# Patient Record
Sex: Male | Born: 1960 | Race: White | Hispanic: No | Smoking: Current every day smoker
Health system: Southern US, Community
[De-identification: ages and names within clinical notes are randomized; demographics above are authoritative.]

## PROBLEM LIST (undated history)

## (undated) DIAGNOSIS — I4891 Unspecified atrial fibrillation: Secondary | ICD-10-CM

## (undated) DIAGNOSIS — C649 Malignant neoplasm of unspecified kidney, except renal pelvis: Secondary | ICD-10-CM

## (undated) DIAGNOSIS — I10 Essential (primary) hypertension: Secondary | ICD-10-CM

## (undated) DIAGNOSIS — I519 Heart disease, unspecified: Secondary | ICD-10-CM

## (undated) HISTORY — DX: Malignant neoplasm of unspecified kidney, except renal pelvis: C64.9

## (undated) HISTORY — DX: Heart disease, unspecified: I51.9

## (undated) HISTORY — DX: Essential (primary) hypertension: I10

## (undated) HISTORY — DX: Unspecified atrial fibrillation: I48.91

---

## 1999-06-30 ENCOUNTER — Ambulatory Visit: Admission: RE | Admit: 1999-06-30 | Discharge: 1999-06-30 | Payer: Self-pay | Admitting: *Deleted

## 1999-08-31 ENCOUNTER — Other Ambulatory Visit: Admission: RE | Admit: 1999-08-31 | Discharge: 1999-08-31 | Payer: Self-pay | Admitting: *Deleted

## 2003-05-12 ENCOUNTER — Ambulatory Visit (HOSPITAL_COMMUNITY): Admission: RE | Admit: 2003-05-12 | Discharge: 2003-05-12 | Payer: Self-pay | Admitting: Internal Medicine

## 2003-05-18 ENCOUNTER — Ambulatory Visit (HOSPITAL_BASED_OUTPATIENT_CLINIC_OR_DEPARTMENT_OTHER): Admission: RE | Admit: 2003-05-18 | Discharge: 2003-05-18 | Payer: Self-pay | Admitting: Internal Medicine

## 2018-03-29 DIAGNOSIS — I10 Essential (primary) hypertension: Secondary | ICD-10-CM

## 2018-03-29 DIAGNOSIS — I252 Old myocardial infarction: Secondary | ICD-10-CM

## 2018-03-29 DIAGNOSIS — G4733 Obstructive sleep apnea (adult) (pediatric): Secondary | ICD-10-CM

## 2018-03-29 DIAGNOSIS — Z720 Tobacco use: Secondary | ICD-10-CM

## 2018-03-29 DIAGNOSIS — E785 Hyperlipidemia, unspecified: Secondary | ICD-10-CM

## 2018-03-29 DIAGNOSIS — R079 Chest pain, unspecified: Secondary | ICD-10-CM

## 2018-03-29 DIAGNOSIS — I517 Cardiomegaly: Secondary | ICD-10-CM

## 2018-03-29 DIAGNOSIS — Z8673 Personal history of transient ischemic attack (TIA), and cerebral infarction without residual deficits: Secondary | ICD-10-CM

## 2019-01-25 DIAGNOSIS — Z905 Acquired absence of kidney: Secondary | ICD-10-CM

## 2019-01-25 DIAGNOSIS — Z720 Tobacco use: Secondary | ICD-10-CM

## 2019-01-25 DIAGNOSIS — R011 Cardiac murmur, unspecified: Secondary | ICD-10-CM

## 2019-01-25 DIAGNOSIS — I10 Essential (primary) hypertension: Secondary | ICD-10-CM

## 2019-01-25 DIAGNOSIS — E785 Hyperlipidemia, unspecified: Secondary | ICD-10-CM

## 2019-01-25 DIAGNOSIS — Z8673 Personal history of transient ischemic attack (TIA), and cerebral infarction without residual deficits: Secondary | ICD-10-CM

## 2019-01-25 DIAGNOSIS — I4891 Unspecified atrial fibrillation: Secondary | ICD-10-CM

## 2019-06-16 ENCOUNTER — Other Ambulatory Visit: Payer: Self-pay | Admitting: Family Medicine

## 2019-06-25 ENCOUNTER — Other Ambulatory Visit (HOSPITAL_COMMUNITY): Payer: Self-pay | Admitting: Family Medicine

## 2019-06-25 ENCOUNTER — Other Ambulatory Visit: Payer: Self-pay | Admitting: Family Medicine

## 2019-06-25 DIAGNOSIS — M542 Cervicalgia: Secondary | ICD-10-CM

## 2019-07-04 ENCOUNTER — Ambulatory Visit (HOSPITAL_COMMUNITY)
Admission: RE | Admit: 2019-07-04 | Discharge: 2019-07-04 | Disposition: A | Discharge status: Home / Self Care | Payer: No Typology Code available for payment source | Source: Ambulatory Visit | Attending: Family Medicine | Admitting: Family Medicine

## 2019-07-04 ENCOUNTER — Other Ambulatory Visit: Payer: Self-pay

## 2019-07-04 DIAGNOSIS — M542 Cervicalgia: Secondary | ICD-10-CM | POA: Diagnosis not present

## 2019-07-14 ENCOUNTER — Other Ambulatory Visit (HOSPITAL_COMMUNITY): Payer: Self-pay | Admitting: Family Medicine

## 2019-07-14 ENCOUNTER — Other Ambulatory Visit: Payer: Self-pay | Admitting: Family Medicine

## 2019-07-14 DIAGNOSIS — M545 Low back pain, unspecified: Secondary | ICD-10-CM

## 2019-07-26 ENCOUNTER — Ambulatory Visit (HOSPITAL_COMMUNITY)
Admission: RE | Admit: 2019-07-26 | Discharge: 2019-07-26 | Disposition: A | Discharge status: Home / Self Care | Payer: No Typology Code available for payment source | Source: Ambulatory Visit | Attending: Family Medicine | Admitting: Family Medicine

## 2019-07-26 ENCOUNTER — Other Ambulatory Visit: Payer: Self-pay

## 2019-07-26 DIAGNOSIS — M545 Low back pain, unspecified: Secondary | ICD-10-CM

## 2019-09-02 ENCOUNTER — Encounter: Payer: Self-pay | Admitting: Physical Medicine & Rehabilitation

## 2019-09-25 ENCOUNTER — Other Ambulatory Visit: Payer: Self-pay

## 2019-09-25 ENCOUNTER — Encounter
Payer: No Typology Code available for payment source | Attending: Physical Medicine & Rehabilitation | Admitting: Physical Medicine & Rehabilitation

## 2019-09-25 ENCOUNTER — Encounter: Payer: Self-pay | Admitting: Physical Medicine & Rehabilitation

## 2019-09-25 VITALS — BP 142/81 | HR 60 | Temp 98.0°F | Ht 71.0 in | Wt 300.0 lb

## 2019-09-25 DIAGNOSIS — G894 Chronic pain syndrome: Secondary | ICD-10-CM | POA: Diagnosis present

## 2019-09-25 DIAGNOSIS — Z5181 Encounter for therapeutic drug level monitoring: Secondary | ICD-10-CM

## 2019-09-25 DIAGNOSIS — Z79891 Long term (current) use of opiate analgesic: Secondary | ICD-10-CM | POA: Diagnosis not present

## 2019-09-25 MED ORDER — NORTRIPTYLINE HCL 10 MG PO CAPS: 10.0000 mg | ORAL_CAPSULE | Freq: Every day | ORAL | 1 refills | Status: AC

## 2019-09-25 NOTE — Progress Notes (Signed)
Subjective:    Patient ID: Miguel Briggs, male    DOB: Oct 06, 1960, 59 y.o.   MRN: [id number]  HPI Reason for referral :  Neck and back pain 59 year old male with history of atrial fibrillation, CHF, hypertension and diabetes who was referred through the [place] for the evaluation of chronic neck and back pain. 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, maintenance tech, problems with PTSD, gallbladder surgery then back problem  [employer] disability for PTSD, arthritis, spine Discharged from service 1992  Tried acupuncture , TENS, PT but this was not helpful Mobic- not helpful for pain  "instant release " pain med helpful, patient does not remember the name Hydrocodone 5mg  - finished rx from April was written for QID but only took at noc, "scared to take 2 tabs", this at least helps him sleep at night  Wondering if sedative would work  Pain Inventory Average Pain 10 Pain Right Now 5 My pain is constant, sharp, dull, stabbing, tingling and aching  In the last 24 hours, has pain interfered with the following? General activity 10 Relation with others 10 Enjoyment of life 10 What TIME of day is your pain at its worst? evening & night time. Sleep (in general) Poor  Pain is worse with: walking, bending, sitting and standing Pain improves with: rest and medication Relief from Meds: 9  Mobility walk without assistance how many minutes can you walk? 10-15 ability to climb steps?  no do you drive?  yes Do you have any goals in this area?  yes  Function disabled: date disabled I do not remember the date maybe 2000. I need assistance with the following:  dressing Do you have any goals in this area?  yes  Neuro/Psych weakness tingling anxiety  Prior Studies New patient. MRI of the Lumbar Spine that shows multilevel degenerative disc disease. Primarily at L5-S1, there is a central  disc protrusion with slight caudal migration without associated neural compression. MRI of the Cervical Spine without Contrast shows previous ACDF at the C6-7 level. At C5-6, there is some mild adjacent segment disease with mild left neuroforaminal narrowing without severe spinal stenosis centrally at any level. No evidence of cord signal changes.     Physicians involved in your care New patient.   No family history on file. Social History   Socioeconomic History  . Marital status: Unknown    Spouse name: Not on file  . Number of children: Not on file  . Years of education: Not on file  . Highest education level: Not on file  Occupational History  . Not on file  Tobacco Use  . Smoking status: Not on file  Substance and Sexual Activity  . Alcohol use: Not on file  . Drug use: Not on file  . Sexual activity: Not on file  Other Topics Concern  . Not on file  Social History Narrative  . Not on file   Social Determinants of Health   Financial Resource Strain:   . Difficulty of Paying Living Expenses:   Food Insecurity:   . Worried About Charity fundraiser in the Last Year:   . Arboriculturist in the Last Year:   Transportation Needs:   . Film/video editor (Medical):   Marland Kitchen Lack of Transportation (Non-Medical):   Physical Activity:   . Days of Exercise per Week:   .  Minutes of Exercise per Session:   Stress:   . Feeling of Stress :   Social Connections:   . Frequency of Communication with Friends and Family:   . Frequency of Social Gatherings with Friends and Family:   . Attends Religious Services:   . Active Member of Clubs or Organizations:   . Attends Archivist Meetings:   Marland Kitchen Marital Status:     Past Medical History:  Diagnosis Date  . A-fib ([place])   . Cancer of kidney ([place])   . Heart disease   . Hypertension    BP (!) 142/81   Pulse 60   Temp 98 F (36.7 C)   Ht 5\' 11"  (1.803 m)   Wt 300 lb (136.1 kg)   SpO2 96%   BMI 41.84 kg/m    Opioid Risk Score:   Fall Risk Score:  `1  Depression screen PHQ 2/9  Depression screen PHQ 2/9 09/25/2019  Decreased Interest 0  Down, Depressed, Hopeless 0  PHQ - 2 Score 0  Altered sleeping 0  Tired, decreased energy 0  Change in appetite 0  Feeling bad or failure about yourself  0  Trouble concentrating 0  Moving slowly or fidgety/restless 0  Suicidal thoughts 0  PHQ-9 Score 0   Review of Systems  Constitutional: Negative.   HENT: Negative.   Eyes: Negative.   Respiratory: Positive for cough and shortness of breath.   Cardiovascular: Negative.   Gastrointestinal: Positive for abdominal pain, constipation, diarrhea and nausea.  Endocrine: Negative.   Genitourinary: Negative.   Musculoskeletal: Positive for back pain and gait problem.  Skin: Positive for rash.  Allergic/Immunologic: Negative.   Neurological: Positive for weakness.       Tingling, spasms  Psychiatric/Behavioral:       Axiety       Objective:   Physical Exam Vitals and nursing note reviewed.  Constitutional:      Appearance: He is obese.  HENT:     Head: Normocephalic and atraumatic.  Eyes:     Extraocular Movements: Extraocular movements intact.     Conjunctiva/sclera: Conjunctivae normal.     Pupils: Pupils are equal, round, and reactive to light.  Cardiovascular:     Rate and Rhythm: Normal rate and regular rhythm.  Pulmonary:     Effort: Pulmonary effort is normal. No respiratory distress.     Breath sounds: Normal breath sounds. No stridor. No wheezing.  Abdominal:     General: Abdomen is flat. Bowel sounds are normal. There is no distension.     Palpations: Abdomen is soft.     Tenderness: There is no abdominal tenderness.  Musculoskeletal:     Cervical back: Normal range of motion. Tenderness present.     Comments: Lumbar spine tenderness to light palpation along the lumbar paraspinals Thoracic spine tenderness palpation along the thoracic paraspinals Cervical spine tenderness  palpation along the cervical paraspinals There is decreased range of motion the cervical thoracic and lumbar spine all around 25% of normal with flexion and extension  Neurological:     General: No focal deficit present.     Mental Status: He is alert and oriented to person, place, and time.     Cranial Nerves: No dysarthria or facial asymmetry.     Sensory: Sensation is intact.     Motor: No weakness, atrophy or abnormal muscle tone.     Coordination: Coordination normal.     Gait: Gait is intact.     Comments: Motor strength 5/5  bilateral deltoid bicep tricep grip hip flexor knee extensor ankle dorsiflexor Negative straight leg raising Sensation intact to light touch and pinprick in the upper extremities intact light touch in the lower extremities   Psychiatric:        Mood and Affect: Mood normal.        Behavior: Behavior normal.     Comments: Groaning, pain behaviors with position changes           Assessment & Plan:  #1.  Chronic lumbar pain without radiculopathy.  Given age and high BMI suspect lumbar spondylosis as the primary pain generator we will schedule for bilateral L3-L4 medial branch L5 dorsal ramus injections under fluoroscopic guidance. 2.  Insomnia related to pain with myofascial component to pain will start nortriptyline 10 mg nightly may need to titrate upward with max dose of 50 mg if there is no intolerance #3.  Deconditioning will start with gentle spine range of motion and strengthening exercises for gluteal and abdominal musculature. We discussed aquatic exercise class at the pool, a seniors class would be appropriate given his level of conditioning.  UDS if night time tramadol or hydrocodone needed

## 2019-09-25 NOTE — Patient Instructions (Signed)

## 2019-09-29 LAB — DRUG TOX MONITOR 1 W/CONF, ORAL FLD
Amphetamines: NEGATIVE ng/mL (ref <10)
Barbiturates: NEGATIVE ng/mL (ref <10)
Benzodiazepines: NEGATIVE ng/mL (ref <0.50)
Buprenorphine: NEGATIVE ng/mL (ref <0.10)
Cocaine: NEGATIVE ng/mL (ref <5.0)
Cotinine: >250 ng/mL — ABNORMAL HIGH (ref <5.0)
Fentanyl: NEGATIVE ng/mL (ref <0.10)
Heroin Metabolite: NEGATIVE ng/mL (ref <1.0)
MARIJUANA: NEGATIVE ng/mL (ref <2.5)
MDMA: NEGATIVE ng/mL (ref <10)
Meprobamate: NEGATIVE ng/mL (ref <2.5)
Methadone: NEGATIVE ng/mL (ref <5.0)
Nicotine Metabolite: POSITIVE ng/mL — AB (ref <5.0)
Opiates: NEGATIVE ng/mL (ref <2.5)
Phencyclidine: NEGATIVE ng/mL (ref <10)
Tapentadol: NEGATIVE ng/mL (ref <5.0)
Tramadol: NEGATIVE ng/mL (ref <5.0)
Zolpidem: NEGATIVE ng/mL (ref <5.0)

## 2019-09-29 LAB — DRUG TOX ALC METAB W/CON, ORAL FLD: Alcohol Metabolite: NEGATIVE ng/mL (ref <25)

## 2019-10-01 ENCOUNTER — Telehealth: Payer: Self-pay | Admitting: *Deleted

## 2019-10-01 NOTE — Telephone Encounter (Signed)
Oral swab drug screen was consistent for no prescribed controlled medications.

## 2019-10-16 ENCOUNTER — Encounter: Payer: No Typology Code available for payment source | Admitting: Physical Medicine & Rehabilitation

## 2019-11-07 ENCOUNTER — Ambulatory Visit: Payer: Self-pay | Admitting: Physical Medicine & Rehabilitation

## 2021-11-20 IMAGING — MR MR CERVICAL SPINE W/O CM
4 of 6 series · 19 of 48 positions shown · non-contrast
Comparison: None.

CLINICAL DATA: Neck pain with tingling for 3 weeks.

EXAM:
MRI CERVICAL SPINE WITHOUT CONTRAST
TECHNIQUE: Multiplanar, multisequence MR imaging of the cervical spine was
performed. No intravenous contrast was administered.

[Series 2: T2 · sagittal · 3.0mm · 0.41mm/px · 3 of 16 slices shown (1 of 2)]
[im 1/16]
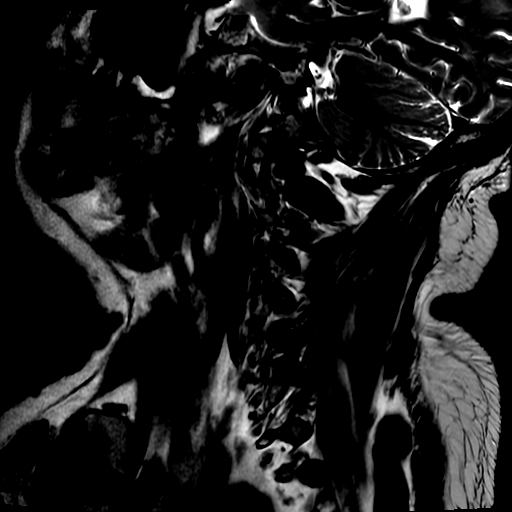
[im 8/16]
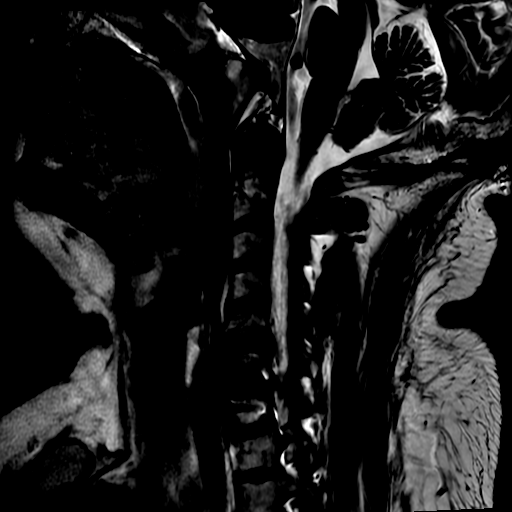
[im 16/16]
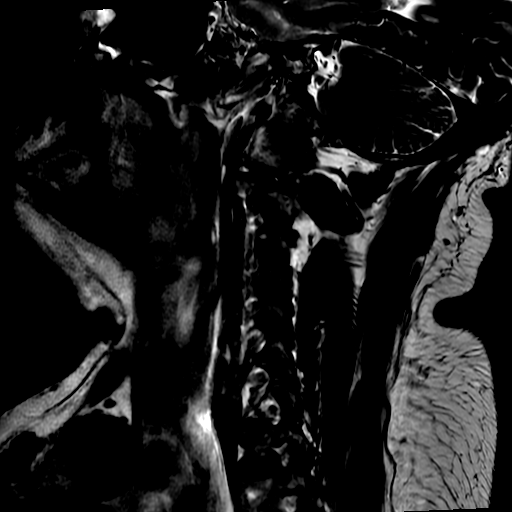

[Series 4: STIR · sagittal · 3.0mm · 0.41mm/px · 3 of 16 slices shown]
[im 1/16]
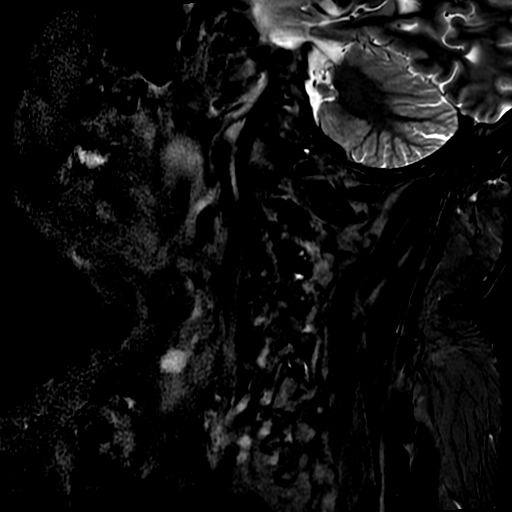
[im 8/16]
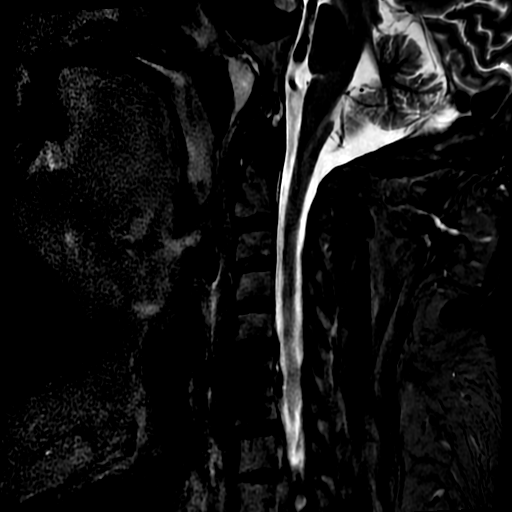
[im 16/16]
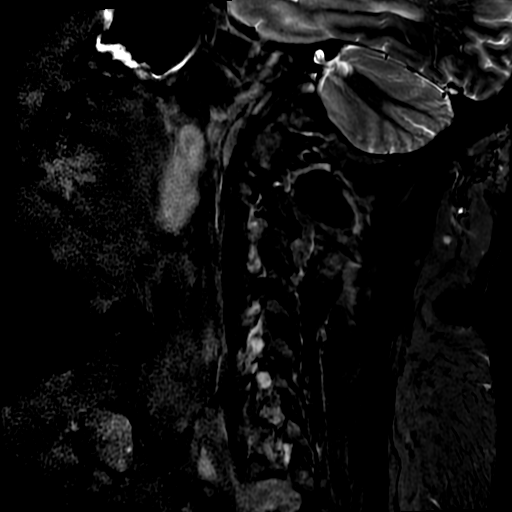

[Series 6: T2 · axial · 3.0mm · 0.35mm/px · z∈[-148,-26]mm · 8 of 39 slices shown (2 of 2)]
[im 1/39]
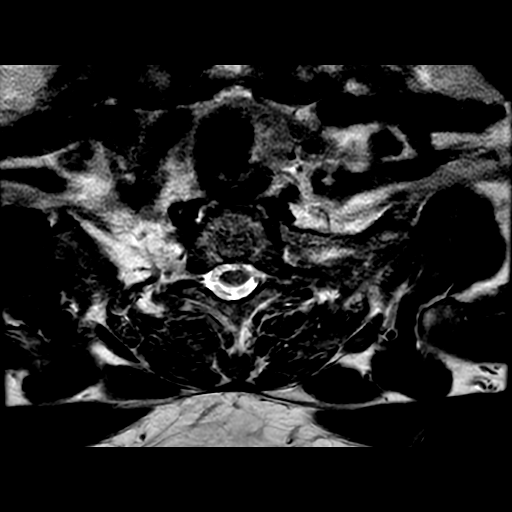
[im 6/39]
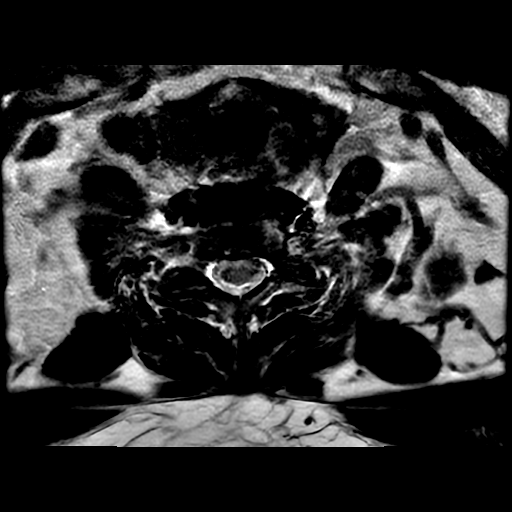
[im 11/39]
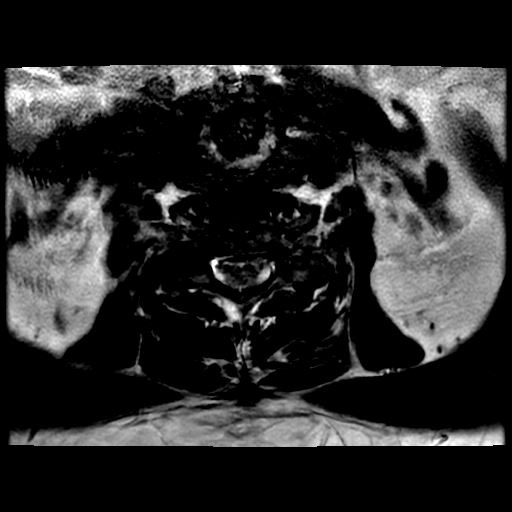
[im 17/39]
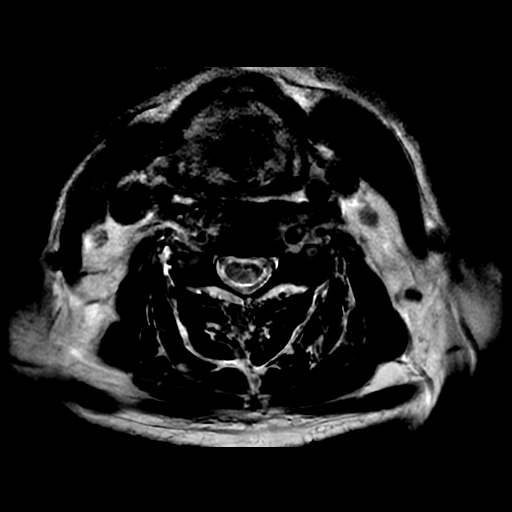
[im 22/39]
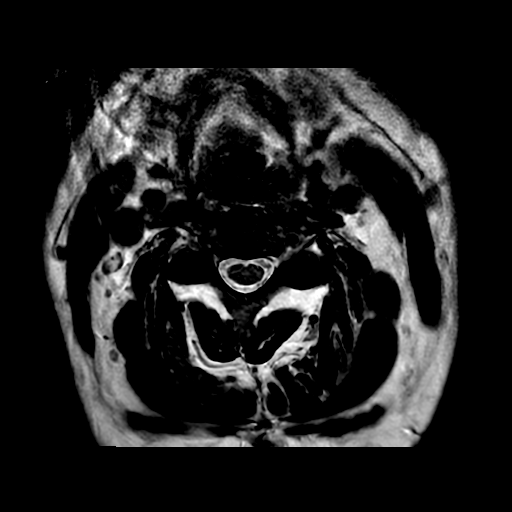
[im 28/39]
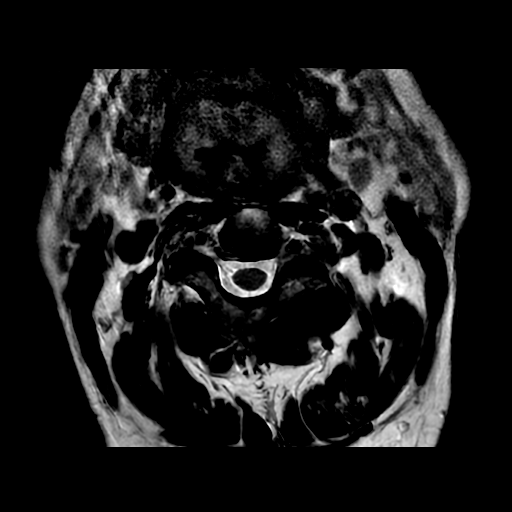
[im 33/39]
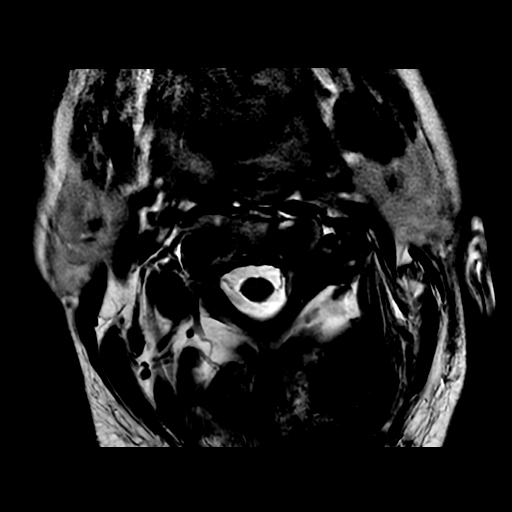
[im 39/39]
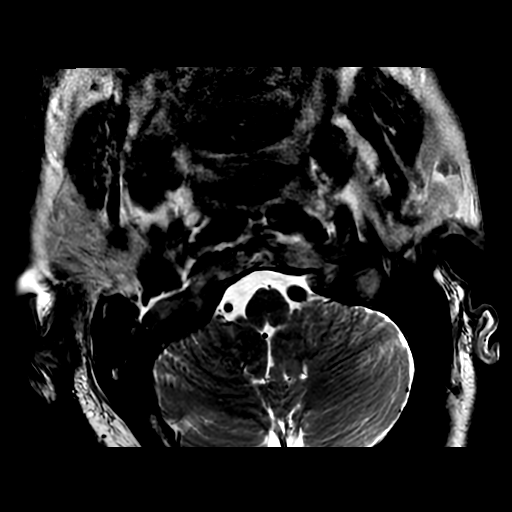

[Series 7: T1 · axial · non-contrast · 3.0mm · 0.35mm/px · z∈[-151,-48]mm · 5 of 39 slices shown]
[im 1/39]
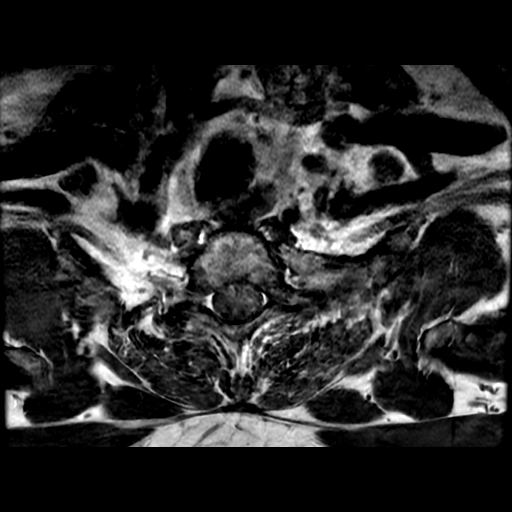
[im 6/39]
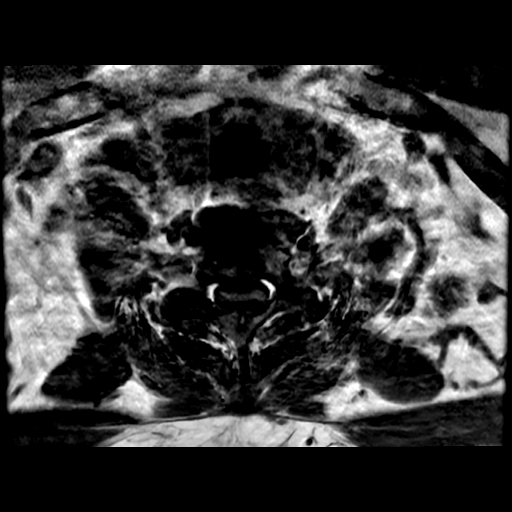
[im 11/39]
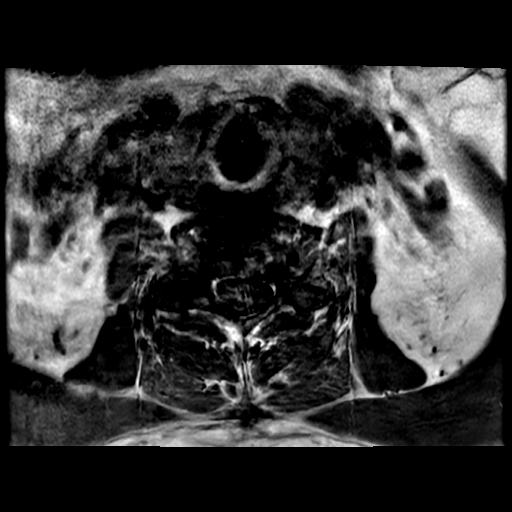
[im 22/39]
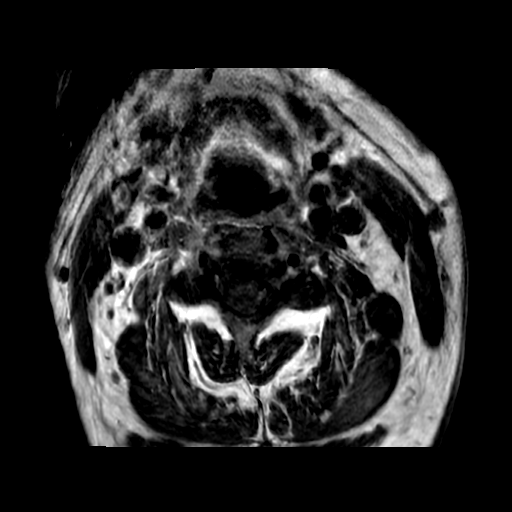
[im 33/39]
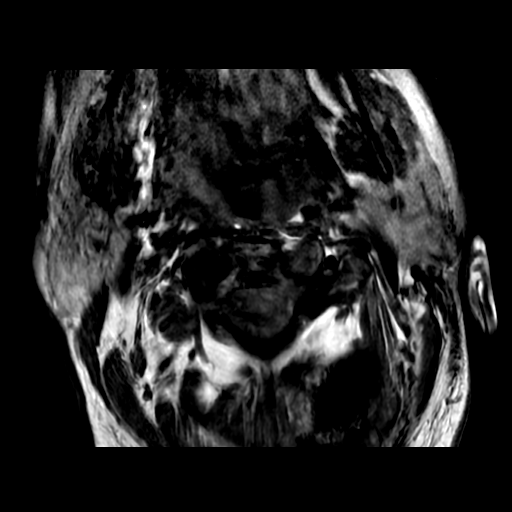

[19 of 48 positions shown; findings below may reference images not displayed]

FINDINGS: The study is motion degraded including moderate to severe motion on
the axial T1 and axial T2 gradient sequences.

Alignment: Cervical spine straightening. No listhesis.

Vertebrae: C6-7 ACDF. No fracture, suspicious osseous lesion, or
significant marrow edema.

Cord: Normal signal and morphology.

Posterior Fossa, vertebral arteries, paraspinal tissues: Small right
maxillary sinus mucous retention cyst. Preserved vertebral artery
flow voids. No paraspinal soft tissue edema. 1 cm T2 hyperintense
left thyroid nodule (not clinically significant; no follow-up
imaging recommended).

Disc levels:

C2-3: Moderate facet arthrosis and minimal uncovertebral spurring
result in mild right neural foraminal stenosis without spinal
stenosis.

C3-4: Mild facet arthrosis and minimal uncovertebral spurring result
in mild right neural foraminal stenosis without spinal stenosis.

C4-5: Mild facet arthrosis and left uncovertebral spurring result in
mild left neural foraminal stenosis without spinal stenosis.

C5-6: Small central disc protrusion/possible extrusion without
spinal stenosis or spinal cord mass effect. Mild disc bulging and
left uncovertebral spurring result in mild left neural foraminal
stenosis.

C6-7: ACDF. No stenosis.

C7-T1: Mild right uncovertebral spurring without stenosis.
IMPRESSION: 1. Motion degraded examination.
2. C6-7 ACDF without stenosis.
3. Mild spondylosis and facet degeneration elsewhere with mild
neural foraminal narrowing as above. No spinal stenosis.

## 2022-02-03 DIAGNOSIS — I517 Cardiomegaly: Secondary | ICD-10-CM

## 2024-04-24 ENCOUNTER — Ambulatory Visit (HOSPITAL_BASED_OUTPATIENT_CLINIC_OR_DEPARTMENT_OTHER)
Admission: EM | Admit: 2024-04-24 | Discharge: 2024-04-24 | Disposition: A | Discharge status: Home / Self Care | Source: Home / Self Care

## 2024-04-24 ENCOUNTER — Other Ambulatory Visit (HOSPITAL_BASED_OUTPATIENT_CLINIC_OR_DEPARTMENT_OTHER): Payer: Self-pay

## 2024-04-24 ENCOUNTER — Encounter (HOSPITAL_BASED_OUTPATIENT_CLINIC_OR_DEPARTMENT_OTHER): Payer: Self-pay

## 2024-04-24 DIAGNOSIS — H5789 Other specified disorders of eye and adnexa: Secondary | ICD-10-CM

## 2024-04-24 MED ORDER — ERYTHROMYCIN 5 MG/GM OP OINT
TOPICAL_OINTMENT | OPHTHALMIC | 0 refills | Status: AC
  Filled 2024-04-24: qty 3.5, 7d supply, fill #0

## 2024-04-24 MED ORDER — AMOXICILLIN-POT CLAVULANATE 875-125 MG PO TABS
1.0000 | ORAL_TABLET | Freq: Two times a day (BID) | ORAL | 0 refills | Status: AC
  Filled 2024-04-24: qty 14, 7d supply, fill #0

## 2024-04-24 NOTE — ED Provider Notes (Signed)
 " [facility]    CSN: [id number] Arrival date & time: 04/24/24  1614      History   Chief Complaint Chief Complaint  Patient presents with   Eye Problem    HPI Miguel Briggs is a 64 y.o. male.   Pt c/o left eye redness, swelling, and itching around his eye for 2 days. He has used ice and warm compresses with no relief. Reports he has had eye infections and hx of eye implants in the past. No pain or fever.     Eye Problem   Past Medical History:  Diagnosis Date   A-fib [facility])    Cancer of kidney ([facility])    Heart disease    Hypertension     There are no active problems to display for this patient.   Past Surgical History:  Procedure Laterality Date   EYE SURGERY Bilateral    NEPHRECTOMY, PARTIAL, LAPAROSCOPIC Left        Home Medications    Prior to Admission medications  Medication Sig Start Date End Date Taking? Authorizing Provider  amoxicillin -clavulanate (AUGMENTIN ) 875-125 MG tablet Take 1 tablet by mouth every 12 (twelve) hours. 04/24/24  Yes Adah Corning A, FNP  erythromycin  ophthalmic ointment Place a 1/2 inch ribbon of ointment into the lower eyelid 3-4 times a day 04/24/24  Yes Adelyna Brockman A, FNP  glipiZIDE (GLUCOTROL) 5 MG tablet Take by mouth daily before breakfast.   Yes [provider]  hydrochlorothiazide (HYDRODIURIL) 25 MG tablet Take 25 mg by mouth. 04/20/14  Yes [provider]  Semaglutide (OZEMPIC, 0.25 OR 0.5 MG/DOSE, [place]) Inject into the skin.   Yes [provider]  apixaban (ELIQUIS) 5 MG TABS tablet Take 5 mg by mouth 2 (two) times daily.    [provider]  atenolol (TENORMIN) 100 MG tablet Take by mouth.    [provider]  clonazePAM (KLONOPIN) 1 MG tablet Take by mouth.    [provider]  furosemide (LASIX) 40 MG tablet Take 40 mg by mouth.    [provider]  nortriptyline  (PAMELOR ) 10 MG capsule Take 1 capsule (10 mg total) by mouth at bedtime. 09/25/19   Kirsteins,  Prentice BRAVO, MD  potassium chloride (KLOR-CON) 10 MEQ tablet Take 10 mEq by mouth 2 (two) times daily.    [provider]    Family History History reviewed. No pertinent family history.  Social History Social History[1]   Allergies   Aspirin and Penicillins   Review of Systems Review of Systems  See HPI Physical Exam Triage Vital Signs ED Triage Vitals  Encounter Vitals Group     BP 04/24/24 1633 104/70     Girls Systolic BP Percentile --      Girls Diastolic BP Percentile --      Boys Systolic BP Percentile --      Boys Diastolic BP Percentile --      Pulse Rate 04/24/24 1633 73     Resp 04/24/24 1633 20     Temp 04/24/24 1633 98.2 F (36.8 C)     Temp Source 04/24/24 1633 Oral     SpO2 04/24/24 1633 95 %     Weight --      Height --      Head Circumference --      Peak Flow --      Pain Score 04/24/24 1629 0     Pain Loc --      Pain Education --  Exclude from Growth Chart --    No data found.  Updated Vital Signs BP 104/70 (BP Location: Right Arm)   Pulse 73   Temp 98.2 F (36.8 C) (Oral)   Resp 20   SpO2 95%   Visual Acuity Right Eye Distance:   Left Eye Distance:   Bilateral Distance:    Right Eye Near:   Left Eye Near:    Bilateral Near:     Physical Exam Constitutional:      Appearance: Normal appearance.  Eyes:     General:        Left eye: Discharge present.    Comments: Moderate left upper eyelid swelling, erythema and drainage from inner eye. TTP.   Pulmonary:     Effort: Pulmonary effort is normal.  Musculoskeletal:        General: Normal range of motion.  Neurological:     Mental Status: He is alert.  Psychiatric:        Mood and Affect: Mood normal.      UC Treatments / Results  Labs (all labs ordered are listed, but only abnormal results are displayed) Labs Reviewed - No data to display  EKG   Radiology No results found.  Procedures Procedures (including critical care time)  Medications Ordered in  UC Medications - No data to display  Initial Impression / Assessment and Plan / UC Course  I have reviewed the triage vital signs and the nursing notes.  Pertinent labs & imaging results that were available during my care of the patient were reviewed by me and considered in my medical decision making (see chart for details).     Left eye swelling- treating for left eye infection. Somewhat complicated due to previous eye surgeries. Green drainage from inner eye. Mild periorbital cellulitis.  Recommended warm compresses to the eye with gentle cleanses.  Apply the erythromycin  ointment as prescribed.  Augmentin  as prescribed for better coverage due to history of eye surgery and the green drainage.   Recommend close watch on the area and follow-up for any continued or worsening problems. Final Clinical Impressions(s) / UC Diagnoses   Final diagnoses:  Eye swelling, left     Discharge Instructions      Cleanse the eye 3-4 times a day and apply warm compresses.  Then apply the erythromycin  ointment.  Oral antibiotic for infection.  Follow-up as needed    ED Prescriptions     Medication Sig Dispense Auth. Provider   erythromycin  ophthalmic ointment Place a 1/2 inch ribbon of ointment into the lower eyelid 3-4 times a day 3.5 g Loreda Silverio A, FNP   amoxicillin -clavulanate (AUGMENTIN ) 875-125 MG tablet Take 1 tablet by mouth every 12 (twelve) hours. 14 tablet Adah Wilbert LABOR, FNP      PDMP not reviewed this encounter.    [1]  Social History Tobacco Use   Smoking status: Every Day    Current packs/day: 1.50    Types: Cigarettes   Smokeless tobacco: Never  Vaping Use   Vaping status: Never Used     Adah Wilbert LABOR, FNP 04/24/24 1650  "

## 2024-04-24 NOTE — ED Triage Notes (Signed)
 Pt c/o left eye redness, swelling, and itching around his eye for 2 days. He has used ice and warm compresses with no relief. Reports he has had eye infections and hx of eye implants in the past.

## 2024-04-24 NOTE — Discharge Instructions (Signed)
 Cleanse the eye 3-4 times a day and apply warm compresses.  Then apply the erythromycin  ointment.  Oral antibiotic for infection.  Follow-up as needed
